# Patient Record
Sex: Male | Born: 1990 | Race: White | Hispanic: No | Marital: Single | Smoking: Current some day smoker
Health system: Southern US, Community
[De-identification: ages and names within clinical notes are randomized; demographics above are authoritative.]

## PROBLEM LIST (undated history)

## (undated) DIAGNOSIS — A4902 Methicillin resistant Staphylococcus aureus infection, unspecified site: Secondary | ICD-10-CM

## (undated) HISTORY — PX: TONSILLECTOMY: SUR1361

---
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
## 2011-01-31 ENCOUNTER — Emergency Department (HOSPITAL_COMMUNITY)
Admission: EM | Admit: 2011-01-31 | Discharge: 2011-01-31 | Discharge status: Against Medical Advice | Payer: Medicaid Other | Attending: Emergency Medicine | Admitting: Emergency Medicine

## 2011-01-31 ENCOUNTER — Encounter: Payer: Self-pay | Admitting: *Deleted

## 2011-01-31 DIAGNOSIS — R238 Other skin changes: Secondary | ICD-10-CM | POA: Insufficient documentation

## 2011-01-31 NOTE — ED Notes (Signed)
Pt in c/o abscess to left hand x3-4 days

## 2011-03-10 ENCOUNTER — Emergency Department (INDEPENDENT_AMBULATORY_CARE_PROVIDER_SITE_OTHER)
Admission: EM | Admit: 2011-03-10 | Discharge: 2011-03-10 | Disposition: A | Discharge status: Home / Self Care | Payer: Medicaid Other | Source: Home / Self Care | Attending: Emergency Medicine | Admitting: Emergency Medicine

## 2011-03-10 ENCOUNTER — Encounter (HOSPITAL_COMMUNITY): Payer: Self-pay | Admitting: Emergency Medicine

## 2011-03-10 DIAGNOSIS — IMO0002 Reserved for concepts with insufficient information to code with codable children: Secondary | ICD-10-CM

## 2011-03-10 DIAGNOSIS — L02413 Cutaneous abscess of right upper limb: Secondary | ICD-10-CM

## 2011-03-10 HISTORY — DX: Methicillin resistant Staphylococcus aureus infection, unspecified site: A49.02

## 2011-03-10 MED ORDER — HYDROCODONE-ACETAMINOPHEN 5-325 MG PO TABS: 2.0000 | ORAL_TABLET | ORAL | Status: AC | PRN

## 2011-03-10 MED ORDER — LIDOCAINE HCL 1 % IJ SOLN
5.0000 mL | Freq: Once | INTRAMUSCULAR | Status: AC
  Administered 2011-03-10: 5 mL

## 2011-03-10 MED ORDER — BACITRACIN 500 UNIT/GM EX OINT
1.0000 "application " | TOPICAL_OINTMENT | Freq: Once | CUTANEOUS | Status: AC
  Administered 2011-03-10: 1 via TOPICAL

## 2011-03-10 MED ORDER — IBUPROFEN 600 MG PO TABS: 600.0000 mg | ORAL_TABLET | Freq: Four times a day (QID) | ORAL | Status: AC | PRN

## 2011-03-10 MED ORDER — DOXYCYCLINE HYCLATE 100 MG PO CAPS: 100.0000 mg | ORAL_CAPSULE | Freq: Two times a day (BID) | ORAL | Status: AC

## 2011-03-10 NOTE — ED Notes (Signed)
Patient has a large, red area to right elbow.  Scabbing to wound.  Patient has been using tweezers, etc and squeezing .  Onset 6 days ago.  Reports hot and cold episodes last night, unknown fever.

## 2011-03-10 NOTE — ED Provider Notes (Signed)
History     CSN: [id number]  Arrival date & time 03/10/11  1105   First MD Initiated Contact with Patient 03/10/11 1121      Chief Complaint  Patient presents with  . Abscess    (Consider location/radiation/quality/duration/timing/severity/associated sxs/prior treatment) HPI Comments: Patient with painful, erythematous mass ofgradually increasing size over his right elbow started 6 days ago after sustaining minor trauma to the area. Patient states that he has been squeezing the area and expressing purulent material, however, reports redness streaking up the inner aspect of his arm last night. Now reports painful swelling underneath his right axilla. States he felt "hot" last night, but no documented fevers. No nausea, vomiting, other lesions currently. Patient states is not diabetic. Has a history of recurrent MRSA infections.  ROS as noted in HPI. All other ROS negative.   Patient is a 21 y.o. male presenting with abscess. The history is provided by the patient.  Abscess  This is a recurrent problem. The problem has been gradually worsening. The abscess is present on the right arm.    Past Medical History  Diagnosis Date  . MRSA infection     Past Surgical History  Procedure Date  . Tonsillectomy     No family history on file.  History  Substance Use Topics  . Smoking status: Current Everyday Smoker  . Smokeless tobacco: Not on file  . Alcohol Use: No      Review of Systems  Allergies  Review of patient's allergies indicates no known allergies.  Home Medications   Current Outpatient Rx  Name Route Sig Dispense Refill  . DOXYCYCLINE HYCLATE 100 MG PO CAPS Oral Take 1 capsule (100 mg total) by mouth 2 (two) times daily. 20 capsule 0  . HYDROCODONE-ACETAMINOPHEN 5-325 MG PO TABS Oral Take 2 tablets by mouth every 4 (four) hours as needed for pain. 20 tablet 0  . IBUPROFEN 600 MG PO TABS Oral Take 1 tablet (600 mg total) by mouth every 6 (six) hours as needed for  pain. 30 tablet 0    BP 143/93  Pulse 94  Temp(Src) 97.5 F (36.4 C) (Oral)  Resp 16  SpO2 98%  Physical Exam  Nursing note and vitals reviewed. Constitutional: He is oriented to person, place, and time. He appears well-developed and well-nourished.  HENT:  Head: Normocephalic and atraumatic.  Eyes: Conjunctivae and EOM are normal.  Neck: Normal range of motion.  Cardiovascular: Regular rhythm.   Pulmonary/Chest: Effort normal. No respiratory distress.  Abdominal: He exhibits no distension.  Musculoskeletal: Normal range of motion.       Arms: Neurological: He is alert and oriented to person, place, and time.  Skin: Skin is warm and dry.  Psychiatric: He has a normal mood and affect. His behavior is normal.    ED Course  INCISION AND DRAINAGE Performed by: Luiz Blare Authorized by: Luiz Blare Consent: Verbal consent obtained. Written consent not obtained. Risks and benefits: risks, benefits and alternatives were discussed Consent given by: patient Patient understanding: patient states understanding of the procedure being performed Patient consent: the patient's understanding of the procedure matches consent given Procedure consent: procedure consent matches procedure scheduled Site marked: the operative site was marked Required items: required blood products, implants, devices, and special equipment available Patient identity confirmed: verbally with patient and arm band Time out: Immediately prior to procedure a "time out" was called to verify the correct patient, procedure, equipment, support staff and site/side marked as required. Type: abscess  Body area: upper extremity Location details: right arm Anesthesia: local infiltration Local anesthetic: lidocaine 2% without epinephrine Anesthetic total: 5 ml Patient sedated: no Scalpel size: 11 Incision type: Cruciate. Complexity: simple Drainage: purulent and bloody Drainage amount: moderate Wound  treatment: wound left open Patient tolerance: Patient tolerated the procedure well with no immediate complications. Comments: Perform blunt dissection to break up loculations. Marked area of induration. Apply bacitracin, sterile pressure dressing.   (including critical care time)  Labs Reviewed - No data to display No results found.   1. Abscess of arm, right       MDM  Patient seen ED for multiple MRSA abscesses in the past. Performed I&D today. Marked area of induration for reference. Appears nontoxic. Afebrile here, no history of fever at home. Starting on doxycycline. Bringing patient back tomorrow for reevaluation. If no improvement, will send to ED for IV antibiotics. If patient's pain, redness gets worse tonight or patient has fever, patient go immediately to the ED. Patient and family member expressed understanding.  Luiz Blare, MD 03/10/11 [phone]

## 2011-03-10 NOTE — ED Notes (Signed)
Patient also has burning pain up right arm and large area of swelling in right axilla

## 2011-03-11 ENCOUNTER — Emergency Department (HOSPITAL_COMMUNITY)
Admission: EM | Admit: 2011-03-11 | Discharge: 2011-03-11 | Disposition: A | Discharge status: Home / Self Care | Payer: Medicaid Other | Source: Home / Self Care | Attending: Emergency Medicine | Admitting: Emergency Medicine

## 2011-03-11 DIAGNOSIS — Z5321 Procedure and treatment not carried out due to patient leaving prior to being seen by health care provider: Secondary | ICD-10-CM

## 2011-03-11 NOTE — ED Notes (Signed)
Pt called in lobby x 1 

## 2011-03-11 NOTE — ED Notes (Signed)
Patient was not in lobby or outside.  Per Dr Andreas Blower request I called patient.  I spoke with patient's mother.   She stated patient's daughter soiled her clothes and they had to go home and change her.  Mother stated that redness and swelling was getting better.  I explained the importance of returning for re-evaluation.  Mother expressed understanding.  Mother stated they would try to return today.  I explained again the importance of him being seen today.  I explained to mother that the ER was always open and if they could not return during our hours they could go to ER.  Mother expressed understanding

## 2011-03-12 ENCOUNTER — Encounter (HOSPITAL_COMMUNITY): Payer: Self-pay | Admitting: *Deleted

## 2011-03-12 ENCOUNTER — Emergency Department (HOSPITAL_COMMUNITY)
Admission: EM | Admit: 2011-03-12 | Discharge: 2011-03-12 | Disposition: A | Discharge status: Home / Self Care | Payer: Medicaid Other | Attending: Emergency Medicine | Admitting: Emergency Medicine

## 2011-03-12 DIAGNOSIS — Z9889 Other specified postprocedural states: Secondary | ICD-10-CM | POA: Insufficient documentation

## 2011-03-12 DIAGNOSIS — L0293 Carbuncle, unspecified: Secondary | ICD-10-CM

## 2011-03-12 DIAGNOSIS — L0292 Furuncle, unspecified: Secondary | ICD-10-CM | POA: Insufficient documentation

## 2011-03-12 DIAGNOSIS — Z8614 Personal history of Methicillin resistant Staphylococcus aureus infection: Secondary | ICD-10-CM | POA: Insufficient documentation

## 2011-03-12 MED ORDER — BACITRACIN ZINC 500 UNIT/GM EX OINT
TOPICAL_OINTMENT | CUTANEOUS | Status: AC
  Filled 2011-03-12: qty 0.9

## 2011-03-12 NOTE — ED Notes (Signed)
Pt states "was seen @ [facility] x 2 days ago, was told it was MRSA & needed to f/u, we went back but it was too full so we left, they called & said we needed to be seen, it actually looks better since taking the abs, I've had [facility] several other places"

## 2011-03-12 NOTE — ED Provider Notes (Signed)
History     CSN: [id number]  Arrival date & time 03/12/11  1245   First MD Initiated Contact with Patient 03/12/11 1316      Chief Complaint  Patient presents with  . Wound Infection    (Consider location/radiation/quality/duration/timing/severity/associated sxs/prior treatment) The history is provided by the patient.   patient is a 21 year old male presents for followup of I&D of abscess that was done in urgent care 2 days ago. Patient went back to urgent care but the wait was too long so he came here. Patient's had history of MRSA infections in the past was restarted on the doxycycline 2 days ago. Patient states that the abscess there is doing much better compared to wear was 2 days ago.  Past Medical History  Diagnosis Date  . MRSA infection     Past Surgical History  Procedure Date  . Tonsillectomy     No family history on file.  History  Substance Use Topics  . Smoking status: Current Everyday Smoker  . Smokeless tobacco: Not on file  . Alcohol Use: No      Review of Systems  Constitutional: Negative for fever and chills.  HENT: Negative for congestion, neck pain and neck stiffness.   Eyes: Negative for redness.  Respiratory: Negative for cough and shortness of breath.   Cardiovascular: Negative for chest pain.  Gastrointestinal: Negative for nausea, vomiting and abdominal pain.  Genitourinary: Negative for dysuria.  Musculoskeletal: Negative for back pain.  Skin: Positive for wound. Negative for rash.  Neurological: Negative for headaches.  Hematological: Does not bruise/bleed easily.    Allergies  Review of patient's allergies indicates no known allergies.  Home Medications   Current Outpatient Rx  Name Route Sig Dispense Refill  . DOXYCYCLINE HYCLATE 100 MG PO CAPS Oral Take 1 capsule (100 mg total) by mouth 2 (two) times daily. 20 capsule 0  . HYDROCODONE-ACETAMINOPHEN 5-325 MG PO TABS Oral Take 2 tablets by mouth every 4 (four) hours as needed  for pain. 20 tablet 0  . IBUPROFEN 600 MG PO TABS Oral Take 1 tablet (600 mg total) by mouth every 6 (six) hours as needed for pain. 30 tablet 0    BP 139/81  Pulse 96  Temp(Src) 98 F (36.7 C) (Oral)  Resp 18  Wt 205 lb (92.987 kg)  SpO2 98%  Physical Exam  Nursing note and vitals reviewed. Constitutional: He is oriented to person, place, and time. He appears well-developed and well-nourished. No distress.  HENT:  Head: Normocephalic and atraumatic.  Eyes: Conjunctivae and EOM are normal. Pupils are equal, round, and reactive to light.  Neck: Normal range of motion. Neck supple.  Cardiovascular: Normal rate, regular rhythm and normal heart sounds.   No murmur heard. Pulmonary/Chest: Effort normal and breath sounds normal.  Abdominal: Soft. Bowel sounds are normal. There is no tenderness.  Musculoskeletal: He exhibits tenderness. He exhibits no edema.       Normal except for right posterior elbow with 2 cm open carbuncle abscess no purulent discharge some surrounding indurated induration erythema and only about 5 mm around the edge of the wound. Distal neurovascular is intact. No significant axillary adenopathy no red streaking.  Neurological: He is alert and oriented to person, place, and time. No cranial nerve deficit. He exhibits normal muscle tone. Coordination normal.  Skin: Skin is warm. No rash noted. There is erythema.    ED Course  Procedures (including critical care time)  Labs Reviewed - No data to display No  results found.   1. Carbuncle       MDM    Patient to the emergency department status post I&D of abscess of right posterior elbow at urgent care 2 days ago. The patient went back to urgent care but the wait was too long so he came here. The patient is currently taking doxycycline. [place] the patient abscess area is doing much better much smaller drained a lot of pus and the discomfort he had in his axillary area due to adenopathy has resolved. Independent  marker around the abscessed marking erythema erythema is markedly improved no further fluctuance some induration still present.  Patient struck the dorsal wound daily with soap and water continue take the doxycycline and to dress the wound with Neosporin and clean dressing made we use Ace wrap to wrap it.        Shelda Jakes, MD 03/12/11 [phone]

## 2011-03-12 NOTE — ED Notes (Signed)
Confirmed MRSA by urgent care. F/U needed due to possible lymph node involvement. Pt states some drainage at site of elbow.

## 2011-03-12 NOTE — ED Provider Notes (Signed)
History     CSN: [id number]  Arrival date & time 03/11/11  1144   First MD Initiated Contact with Patient 03/11/11 1202      No chief complaint on file.   (Consider location/radiation/quality/duration/timing/severity/associated sxs/prior treatment) HPI  Past Medical History  Diagnosis Date  . MRSA infection     Past Surgical History  Procedure Date  . Tonsillectomy     No family history on file.  History  Substance Use Topics  . Smoking status: Current Everyday Smoker  . Smokeless tobacco: Not on file  . Alcohol Use: No      Review of Systems  Allergies  Review of patient's allergies indicates no known allergies.  Home Medications   Current Outpatient Rx  Name Route Sig Dispense Refill  . DOXYCYCLINE HYCLATE 100 MG PO CAPS Oral Take 1 capsule (100 mg total) by mouth 2 (two) times daily. 20 capsule 0  . HYDROCODONE-ACETAMINOPHEN 5-325 MG PO TABS Oral Take 2 tablets by mouth every 4 (four) hours as needed for pain. 20 tablet 0  . IBUPROFEN 600 MG PO TABS Oral Take 1 tablet (600 mg total) by mouth every 6 (six) hours as needed for pain. 30 tablet 0    There were no vitals taken for this visit.  Physical Exam  ED Course  Procedures (including critical care time)  Labs Reviewed - No data to display No results found.   No diagnosis found.    MDM  Patient left prior to triage. Had staff call patient, to ask him to come back. They explained to patient importance of followup either here, or in ED, today. Patient's mother expressed understanding.  Luiz Blare, MD 03/12/11 1023

## 2011-04-17 ENCOUNTER — Telehealth: Payer: Self-pay | Admitting: *Deleted

## 2011-04-17 NOTE — Telephone Encounter (Signed)
ERROR

## 2016-01-28 ENCOUNTER — Encounter (HOSPITAL_COMMUNITY): Payer: Self-pay | Admitting: Emergency Medicine

## 2016-01-28 ENCOUNTER — Ambulatory Visit (HOSPITAL_COMMUNITY)
Admission: EM | Admit: 2016-01-28 | Discharge: 2016-01-28 | Disposition: A | Discharge status: Home / Self Care | Payer: Medicaid Other | Attending: Family Medicine | Admitting: Family Medicine

## 2016-01-28 DIAGNOSIS — L2389 Allergic contact dermatitis due to other agents: Secondary | ICD-10-CM | POA: Diagnosis not present

## 2016-01-28 MED ORDER — METHYLPREDNISOLONE SODIUM SUCC 125 MG IJ SOLR
125.0000 mg | Freq: Once | INTRAMUSCULAR | Status: AC
  Administered 2016-01-28: 125 mg via INTRAMUSCULAR

## 2016-01-28 MED ORDER — PREDNISOLONE 15 MG/5ML PO SYRP: ORAL_SOLUTION | ORAL | 0 refills | Status: AC

## 2016-01-28 MED ORDER — HYDROXYZINE HCL 10 MG/5ML PO SYRP: 25.0000 mg | ORAL_SOLUTION | ORAL | 0 refills | Status: DC | PRN

## 2016-01-28 MED ORDER — METHYLPREDNISOLONE SODIUM SUCC 125 MG IJ SOLR
INTRAMUSCULAR | Status: AC
  Filled 2016-01-28: qty 2

## 2016-01-28 MED ORDER — HYDROXYZINE HCL 10 MG/5ML PO SYRP: 25.0000 mg | ORAL_SOLUTION | ORAL | 0 refills | Status: AC | PRN

## 2016-01-28 NOTE — ED Triage Notes (Signed)
Here for rash on face, arms, and abd onset 2 days associated w/swelling on face  Reports he was poss exposed to poison ivy   Taking benadryl and calamine lotion w/no relief.   Denies dyspnea, sob, dysphagia  A&O x4... NAD

## 2016-01-28 NOTE — ED Provider Notes (Signed)
CSN: [id number]     Arrival date & time 01/28/16  1818 History   First MD Initiated Contact with Patient 01/28/16 1953     Chief Complaint  Patient presents with  . Rash   (Consider location/radiation/quality/duration/timing/severity/associated sxs/prior Treatment) Patient c/o rash on arms and face for 2 days. 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   The history is provided by the patient.  Rash  Location:  Full body Quality: blistering, itchiness and redness   Severity:  Severe Onset quality:  Sudden Duration:  2 days Timing:  Constant Progression:  Worsening Chronicity:  New Context: plant contact   Relieved by:  Antihistamines Worsened by:  Nothing Ineffective treatments:  None tried   Past Medical History:  Diagnosis Date  . MRSA infection    Past Surgical History:  Procedure Laterality Date  . TONSILLECTOMY     History reviewed. No pertinent family history. Social History  Substance Use Topics  . Smoking status: Current Every Day Smoker    Packs/day: 1.00  . Smokeless tobacco: Never Used  . Alcohol use No    Review of Systems  Constitutional: Negative.   HENT: Negative.   Eyes: Negative.   Respiratory: Negative.   Cardiovascular: Negative.   Gastrointestinal: Negative.   Endocrine: Negative.   Genitourinary: Negative.   Musculoskeletal: Negative.   Skin: Positive for rash.  Allergic/Immunologic: Negative.   Neurological: Negative.   Hematological: Negative.   Psychiatric/Behavioral: Negative.     Allergies  Patient has no known allergies.  Home Medications   Prior to Admission medications   Medication Sig Start Date End Date Taking? Authorizing Provider  buprenorphine-naloxone (SUBOXONE) 8-2 MG SUBL SL tablet Place 1 tablet under the tongue daily.   Yes Historical Provider, MD  hydrOXYzine (ATARAX) 10 MG/5ML syrup Take 12.5 mLs (25 mg total) by mouth every 4 (four) hours as needed. 01/28/16   Deatra CanterWilliam J Baine Decesare, FNP   prednisoLONE (PRELONE) 15 MG/5ML syrup Take one and half tsp po bid x 2 days then one tsp po bid x 2days then one tsp po qd x 2 days 01/28/16   Deatra CanterWilliam J Chevi Lim, FNP   Meds Ordered and Administered this Visit   Medications  methylPREDNISolone sodium succinate (SOLU-MEDROL) 125 mg/2 mL injection 125 mg (not administered)    BP 118/60 (BP Location: Left Arm)   Pulse 86   Temp 97.9 F (36.6 C) (Oral)   Resp 20   SpO2 99%  No data found.   Physical Exam  Constitutional: He appears well-developed and well-nourished.  HENT:  Head: Normocephalic and atraumatic.  Right Ear: External ear normal.  Left Ear: External ear normal.  Mouth/Throat: Oropharynx is clear and moist.  Eyes: Conjunctivae and EOM are normal. Pupils are equal, round, and reactive to light.  Neck: Normal range of motion. Neck supple.  Cardiovascular: Normal rate, regular rhythm and normal heart sounds.   Pulmonary/Chest: Effort normal and breath sounds normal.  Skin: Rash noted.  Erythematous raised itchy rash on face, arms, and chest.  Nursing note and vitals reviewed.   Urgent Care Course   Clinical Course     Procedures (including critical care time)  Labs Review Labs Reviewed - No data to display  Imaging Review No results found.   Visual Acuity Review  Right Eye Distance:   Left Eye Distance:   Bilateral Distance:    Right Eye Near:   Left Eye Near:    Bilateral Near:  MDM   1. Allergic contact dermatitis due to other agents    Solumedrol 125mg  IM Prednisolone syrup 15mg  / 5ml one and half tsp po bid x 2 days then one tsp po bid x 2 days then one tsp po qd x 4 days then stop #15800ml Hydroxyzine 10mg /375ml 12.775ml po q 4 hours prn #23940ml      Deatra CanterWilliam J Kaylanni Ezelle, FNP 01/28/16 2008

## 2016-01-28 NOTE — ED Notes (Signed)
Discharged with wife.

## 2020-02-26 ENCOUNTER — Emergency Department (HOSPITAL_COMMUNITY): Payer: Medicaid Other

## 2020-02-26 ENCOUNTER — Emergency Department (HOSPITAL_COMMUNITY)
Admission: EM | Admit: 2020-02-26 | Discharge: 2020-02-26 | Disposition: A | Discharge status: Home / Self Care | Payer: Medicaid Other | Source: Home / Self Care | Attending: Emergency Medicine | Admitting: Emergency Medicine

## 2020-02-26 ENCOUNTER — Other Ambulatory Visit: Payer: Self-pay

## 2020-02-26 ENCOUNTER — Encounter (HOSPITAL_COMMUNITY): Payer: Self-pay | Admitting: *Deleted

## 2020-02-26 DIAGNOSIS — S71102A Unspecified open wound, left thigh, initial encounter: Secondary | ICD-10-CM | POA: Insufficient documentation

## 2020-02-26 DIAGNOSIS — F17200 Nicotine dependence, unspecified, uncomplicated: Secondary | ICD-10-CM | POA: Insufficient documentation

## 2020-02-26 DIAGNOSIS — Z20822 Contact with and (suspected) exposure to covid-19: Secondary | ICD-10-CM | POA: Insufficient documentation

## 2020-02-26 DIAGNOSIS — W3400XA Accidental discharge from unspecified firearms or gun, initial encounter: Secondary | ICD-10-CM

## 2020-02-26 LAB — PROTIME-INR
INR: 1 (ref 0.8–1.2)
Prothrombin Time: 13.2 seconds (ref 11.4–15.2)

## 2020-02-26 LAB — RESP PANEL BY RT-PCR (FLU A&B, COVID) ARPGX2
Influenza A by PCR: NEGATIVE
Influenza B by PCR: NEGATIVE
SARS Coronavirus 2 by RT PCR: NEGATIVE

## 2020-02-26 LAB — I-STAT CHEM 8, ED
BUN: 12 mg/dL (ref 6–20)
Calcium, Ion: 1.15 mmol/L (ref 1.15–1.40)
Chloride: 102 mmol/L (ref 98–111)
Creatinine, Ser: 0.8 mg/dL (ref 0.61–1.24)
Glucose, Bld: 142 mg/dL — ABNORMAL HIGH (ref 70–99)
HCT: 44 % (ref 39.0–52.0)
Hemoglobin: 15 g/dL (ref 13.0–17.0)
Potassium: 3.7 mmol/L (ref 3.5–5.1)
Sodium: 138 mmol/L (ref 135–145)
TCO2: 27 mmol/L (ref 22–32)

## 2020-02-26 LAB — COMPREHENSIVE METABOLIC PANEL
ALT: 23 U/L (ref 0–44)
AST: 29 U/L (ref 15–41)
Albumin: 4.3 g/dL (ref 3.5–5.0)
Alkaline Phosphatase: 85 U/L (ref 38–126)
Anion gap: 10 (ref 5–15)
BUN: 12 mg/dL (ref 6–20)
CO2: 26 mmol/L (ref 22–32)
Calcium: 9.1 mg/dL (ref 8.9–10.3)
Chloride: 103 mmol/L (ref 98–111)
Creatinine, Ser: 0.89 mg/dL (ref 0.61–1.24)
GFR, Estimated: >60 mL/min (ref >60)
Glucose, Bld: 149 mg/dL — ABNORMAL HIGH (ref 70–99)
Potassium: 3.7 mmol/L (ref 3.5–5.1)
Sodium: 139 mmol/L (ref 135–145)
Total Bilirubin: 1 mg/dL (ref 0.3–1.2)
Total Protein: 7 g/dL (ref 6.5–8.1)

## 2020-02-26 LAB — CBC
HCT: 45.1 % (ref 39.0–52.0)
Hemoglobin: 14.2 g/dL (ref 13.0–17.0)
MCH: 27.7 pg (ref 26.0–34.0)
MCHC: 31.5 g/dL (ref 30.0–36.0)
MCV: 87.9 fL (ref 80.0–100.0)
Platelets: 225 10*3/uL (ref 150–400)
RBC: 5.13 MIL/uL (ref 4.22–5.81)
RDW: 12.9 % (ref 11.5–15.5)
WBC: 19.2 10*3/uL — ABNORMAL HIGH (ref 4.0–10.5)
nRBC: 0 % (ref 0.0–0.2)

## 2020-02-26 LAB — ETHANOL: Alcohol, Ethyl (B): <10 mg/dL (ref <10)

## 2020-02-26 LAB — LACTIC ACID, PLASMA: Lactic Acid, Venous: 1.4 mmol/L (ref 0.5–1.9)

## 2020-02-26 LAB — SAMPLE TO BLOOD BANK

## 2020-02-26 MED ORDER — CEFAZOLIN SODIUM-DEXTROSE 2-4 GM/100ML-% IV SOLN
2.0000 g | Freq: Once | INTRAVENOUS | Status: AC
  Administered 2020-02-26: 2 g via INTRAVENOUS
  Filled 2020-02-26: qty 100

## 2020-02-26 MED ORDER — LACTATED RINGERS IV BOLUS
1000.0000 mL | Freq: Once | INTRAVENOUS | Status: AC
  Administered 2020-02-26: 1000 mL via INTRAVENOUS

## 2020-02-26 MED ORDER — FENTANYL CITRATE (PF) 100 MCG/2ML IJ SOLN
100.0000 ug | Freq: Once | INTRAMUSCULAR | Status: AC
  Administered 2020-02-26: 100 ug via INTRAVENOUS
  Filled 2020-02-26: qty 2

## 2020-02-26 NOTE — ED Notes (Signed)
Pt removed all monitoring equipment on his own. Stating he is wanting to leave. Dr. Blinda Leatherwood has spoke to the pt and his sig other at the bedside who is taking him home. Risk and benefits enforced by EDP. Pt signed AMA form, witnessed by this RN.
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
## 2020-02-27 ENCOUNTER — Emergency Department (HOSPITAL_COMMUNITY): Payer: Medicaid Other

## 2020-02-27 ENCOUNTER — Inpatient Hospital Stay (HOSPITAL_COMMUNITY)
Admission: EM | Admit: 2020-02-27 | Discharge: 2020-03-02 | DRG: 481 | Discharge status: Against Medical Advice | Payer: Medicaid Other | Attending: Orthopedic Surgery | Admitting: Orthopedic Surgery

## 2020-02-27 DIAGNOSIS — Z9889 Other specified postprocedural states: Secondary | ICD-10-CM

## 2020-02-27 DIAGNOSIS — W3400XA Accidental discharge from unspecified firearms or gun, initial encounter: Secondary | ICD-10-CM

## 2020-02-27 DIAGNOSIS — S7222XA Displaced subtrochanteric fracture of left femur, initial encounter for closed fracture: Secondary | ICD-10-CM | POA: Diagnosis present

## 2020-02-27 DIAGNOSIS — R7989 Other specified abnormal findings of blood chemistry: Secondary | ICD-10-CM

## 2020-02-27 DIAGNOSIS — S7292XA Unspecified fracture of left femur, initial encounter for closed fracture: Principal | ICD-10-CM

## 2020-02-27 DIAGNOSIS — S72352A Displaced comminuted fracture of shaft of left femur, initial encounter for closed fracture: Secondary | ICD-10-CM | POA: Diagnosis present

## 2020-02-27 DIAGNOSIS — S72002A Fracture of unspecified part of neck of left femur, initial encounter for closed fracture: Secondary | ICD-10-CM

## 2020-02-27 DIAGNOSIS — R000 Tachycardia, unspecified: Secondary | ICD-10-CM

## 2020-02-27 DIAGNOSIS — Z20822 Contact with and (suspected) exposure to covid-19: Secondary | ICD-10-CM | POA: Diagnosis present

## 2020-02-27 DIAGNOSIS — Z8614 Personal history of Methicillin resistant Staphylococcus aureus infection: Secondary | ICD-10-CM

## 2020-02-27 DIAGNOSIS — Z8781 Personal history of (healed) traumatic fracture: Secondary | ICD-10-CM

## 2020-02-27 DIAGNOSIS — F17210 Nicotine dependence, cigarettes, uncomplicated: Secondary | ICD-10-CM | POA: Diagnosis present

## 2020-02-27 DIAGNOSIS — F1510 Other stimulant abuse, uncomplicated: Secondary | ICD-10-CM | POA: Diagnosis present

## 2020-02-27 DIAGNOSIS — Z5329 Procedure and treatment not carried out because of patient's decision for other reasons: Secondary | ICD-10-CM | POA: Diagnosis not present

## 2020-02-27 DIAGNOSIS — E871 Hypo-osmolality and hyponatremia: Secondary | ICD-10-CM | POA: Diagnosis present

## 2020-02-27 DIAGNOSIS — Z79899 Other long term (current) drug therapy: Secondary | ICD-10-CM

## 2020-02-27 DIAGNOSIS — S7222XB Displaced subtrochanteric fracture of left femur, initial encounter for open fracture type I or II: Principal | ICD-10-CM | POA: Diagnosis present

## 2020-02-27 LAB — COMPREHENSIVE METABOLIC PANEL
ALT: 20 U/L (ref 0–44)
AST: 33 U/L (ref 15–41)
Albumin: 4 g/dL (ref 3.5–5.0)
Alkaline Phosphatase: 82 U/L (ref 38–126)
Anion gap: 11 (ref 5–15)
BUN: 8 mg/dL (ref 6–20)
CO2: 26 mmol/L (ref 22–32)
Calcium: 8.9 mg/dL (ref 8.9–10.3)
Chloride: 98 mmol/L (ref 98–111)
Creatinine, Ser: 0.78 mg/dL (ref 0.61–1.24)
GFR, Estimated: >60 mL/min (ref >60)
Glucose, Bld: 152 mg/dL — ABNORMAL HIGH (ref 70–99)
Potassium: 3.9 mmol/L (ref 3.5–5.1)
Sodium: 135 mmol/L (ref 135–145)
Total Bilirubin: 1.5 mg/dL — ABNORMAL HIGH (ref 0.3–1.2)
Total Protein: 7.3 g/dL (ref 6.5–8.1)

## 2020-02-27 LAB — CBC WITH DIFFERENTIAL/PLATELET
Abs Immature Granulocytes: 0.04 10*3/uL (ref 0.00–0.07)
Basophils Absolute: 0 10*3/uL (ref 0.0–0.1)
Basophils Relative: 0 %
Eosinophils Absolute: 0 10*3/uL (ref 0.0–0.5)
Eosinophils Relative: 0 %
HCT: 40.7 % (ref 39.0–52.0)
Hemoglobin: 12.9 g/dL — ABNORMAL LOW (ref 13.0–17.0)
Immature Granulocytes: 0 %
Lymphocytes Relative: 8 %
Lymphs Abs: 1.1 10*3/uL (ref 0.7–4.0)
MCH: 27.9 pg (ref 26.0–34.0)
MCHC: 31.7 g/dL (ref 30.0–36.0)
MCV: 88.1 fL (ref 80.0–100.0)
Monocytes Absolute: 1.4 10*3/uL — ABNORMAL HIGH (ref 0.1–1.0)
Monocytes Relative: 10 %
Neutro Abs: 11.5 10*3/uL — ABNORMAL HIGH (ref 1.7–7.7)
Neutrophils Relative %: 82 %
Platelets: 215 10*3/uL (ref 150–400)
RBC: 4.62 MIL/uL (ref 4.22–5.81)
RDW: 13 % (ref 11.5–15.5)
WBC: 14.1 10*3/uL — ABNORMAL HIGH (ref 4.0–10.5)
nRBC: 0 % (ref 0.0–0.2)

## 2020-02-27 LAB — TYPE AND SCREEN
ABO/RH(D): O POS
Antibody Screen: NEGATIVE

## 2020-02-27 LAB — RESP PANEL BY RT-PCR (FLU A&B, COVID) ARPGX2
Influenza A by PCR: NEGATIVE
Influenza B by PCR: NEGATIVE
SARS Coronavirus 2 by RT PCR: NEGATIVE

## 2020-02-27 MED ORDER — HYDROMORPHONE HCL 1 MG/ML IJ SOLN
1.0000 mg | Freq: Once | INTRAMUSCULAR | Status: DC
  Filled 2020-02-27: qty 1

## 2020-02-27 MED ORDER — SODIUM CHLORIDE 0.9 % IV SOLN: INTRAVENOUS | Status: DC

## 2020-02-27 MED ORDER — OXYCODONE HCL 5 MG PO TABS: 10.0000 mg | ORAL_TABLET | ORAL | Status: DC | PRN

## 2020-02-27 MED ORDER — HYDROMORPHONE HCL 1 MG/ML IJ SOLN
0.5000 mg | INTRAMUSCULAR | Status: DC | PRN
  Administered 2020-02-27 – 2020-02-28 (×2): 1 mg via INTRAVENOUS
  Filled 2020-02-27 (×2): qty 1

## 2020-02-27 MED ORDER — ONDANSETRON HCL 4 MG/2ML IJ SOLN: 4.0000 mg | Freq: Four times a day (QID) | INTRAMUSCULAR | Status: DC | PRN

## 2020-02-27 MED ORDER — ACETAMINOPHEN 500 MG PO TABS
1000.0000 mg | ORAL_TABLET | Freq: Four times a day (QID) | ORAL | Status: AC
  Administered 2020-02-27: 1000 mg via ORAL
  Filled 2020-02-27 (×2): qty 2

## 2020-02-27 MED ORDER — METHOCARBAMOL 1000 MG/10ML IJ SOLN: 500.0000 mg | Freq: Four times a day (QID) | INTRAVENOUS | Status: DC | PRN

## 2020-02-27 MED ORDER — METHOCARBAMOL 500 MG PO TABS
500.0000 mg | ORAL_TABLET | Freq: Four times a day (QID) | ORAL | Status: DC | PRN
  Administered 2020-02-27: 500 mg via ORAL
  Filled 2020-02-27: qty 1

## 2020-02-27 MED ORDER — ACETAMINOPHEN 325 MG PO TABS
325.0000 mg | ORAL_TABLET | Freq: Four times a day (QID) | ORAL | Status: DC | PRN
Start: 2020-02-28 — End: 2020-02-28

## 2020-02-27 MED ORDER — OXYCODONE HCL 5 MG PO TABS: 5.0000 mg | ORAL_TABLET | ORAL | Status: DC | PRN

## 2020-02-27 MED ORDER — OXYCODONE-ACETAMINOPHEN 5-325 MG PO TABS
1.0000 | ORAL_TABLET | Freq: Once | ORAL | Status: AC
  Administered 2020-02-27: 1 via ORAL
  Filled 2020-02-27: qty 1

## 2020-02-27 MED ORDER — ONDANSETRON HCL 4 MG PO TABS: 4.0000 mg | ORAL_TABLET | Freq: Four times a day (QID) | ORAL | Status: DC | PRN

## 2020-02-27 NOTE — ED Notes (Signed)
Pt is sleeping at bedside but is easily awakened. Pt's friend remains at bedside for support. On monitor x4, lights dimmed and warm blanket given  For comfort.
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
## 2020-02-27 NOTE — ED Notes (Signed)
Blue and gold tube tops sent to lab.
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
## 2020-02-27 NOTE — ED Notes (Signed)
Initial contact with pt . Pt has friend at bedside for support. Pt states he is only in pain when leg is moved. Pt aware he will be admitted for surgery. Watching tv will continue to monitor.

## 2020-02-27 NOTE — ED Notes (Signed)
Visualized family assisting patient to transfer from wheelchair to triage chair. Triage RN previously verbalized plan to transfer patient to stretcher upon room clean to minimize number of transfers.

## 2020-02-28 ENCOUNTER — Inpatient Hospital Stay (HOSPITAL_COMMUNITY): Payer: Medicaid Other | Admitting: Anesthesiology

## 2020-02-28 ENCOUNTER — Inpatient Hospital Stay (HOSPITAL_COMMUNITY): Payer: Medicaid Other

## 2020-02-28 ENCOUNTER — Encounter (HOSPITAL_COMMUNITY)
Admission: EM | Discharge status: Against Medical Advice | Payer: Self-pay | Source: Home / Self Care | Attending: Orthopedic Surgery

## 2020-02-28 DIAGNOSIS — S7222XA Displaced subtrochanteric fracture of left femur, initial encounter for closed fracture: Secondary | ICD-10-CM | POA: Diagnosis present

## 2020-02-28 HISTORY — PX: INTRAMEDULLARY (IM) NAIL INTERTROCHANTERIC: SHX5875

## 2020-02-28 LAB — CBC
HCT: 33.4 % — ABNORMAL LOW (ref 39.0–52.0)
MCH: 28.5 pg (ref 26.0–34.0)
RDW: 12.7 % (ref 11.5–15.5)

## 2020-02-28 LAB — CREATININE, SERUM: Creatinine, Ser: 0.71 mg/dL (ref 0.61–1.24)

## 2020-02-28 SURGERY — FIXATION, FRACTURE, INTERTROCHANTERIC, WITH INTRAMEDULLARY ROD
Anesthesia: General | Site: Hip | Laterality: Left

## 2020-02-28 MED ORDER — ONDANSETRON HCL 4 MG/2ML IJ SOLN
INTRAMUSCULAR | Status: AC
  Filled 2020-02-28: qty 2

## 2020-02-28 MED ORDER — OXYCODONE HCL 5 MG PO TABS: 5.0000 mg | ORAL_TABLET | ORAL | Status: DC | PRN

## 2020-02-28 MED ORDER — MIDAZOLAM HCL 2 MG/2ML IJ SOLN
INTRAMUSCULAR | Status: DC | PRN
  Administered 2020-02-28: 2 mg via INTRAVENOUS

## 2020-02-28 MED ORDER — ALUM & MAG HYDROXIDE-SIMETH 200-200-20 MG/5ML PO SUSP
30.0000 mL | ORAL | Status: DC | PRN
  Filled 2020-02-28: qty 30

## 2020-02-28 MED ORDER — FENTANYL CITRATE (PF) 100 MCG/2ML IJ SOLN
INTRAMUSCULAR | Status: DC | PRN
  Administered 2020-02-28: 100 ug via INTRAVENOUS

## 2020-02-28 MED ORDER — DEXAMETHASONE SODIUM PHOSPHATE 10 MG/ML IJ SOLN
INTRAMUSCULAR | Status: DC | PRN
  Administered 2020-02-28: 10 mg via INTRAVENOUS

## 2020-02-28 MED ORDER — FENTANYL CITRATE (PF) 250 MCG/5ML IJ SOLN
INTRAMUSCULAR | Status: DC | PRN
  Administered 2020-02-28 (×2): 50 ug via INTRAVENOUS
  Administered 2020-02-28: 150 ug via INTRAVENOUS

## 2020-02-28 MED ORDER — BUPRENORPHINE HCL 8 MG SL SUBL
8.0000 mg | SUBLINGUAL_TABLET | Freq: Every day | SUBLINGUAL | Status: DC
  Filled 2020-02-28 (×3): qty 1

## 2020-02-28 MED ORDER — CEFAZOLIN SODIUM-DEXTROSE 2-4 GM/100ML-% IV SOLN
INTRAVENOUS | Status: AC
  Filled 2020-02-28: qty 100

## 2020-02-28 MED ORDER — KETAMINE HCL 10 MG/ML IJ SOLN
INTRAMUSCULAR | Status: AC
  Filled 2020-02-28: qty 1

## 2020-02-28 MED ORDER — ONDANSETRON HCL 4 MG PO TABS
4.0000 mg | ORAL_TABLET | Freq: Four times a day (QID) | ORAL | Status: DC | PRN
  Filled 2020-02-28: qty 1

## 2020-02-28 MED ORDER — HYDROMORPHONE HCL 1 MG/ML IJ SOLN
0.2500 mg | INTRAMUSCULAR | Status: DC | PRN
Start: 2020-02-28 — End: 2020-02-28
  Administered 2020-02-28 (×3): 0.5 mg via INTRAVENOUS

## 2020-02-28 MED ORDER — HYDROMORPHONE HCL 1 MG/ML IJ SOLN
0.5000 mg | INTRAMUSCULAR | Status: DC | PRN
  Administered 2020-02-29 – 2020-03-01 (×2): 1 mg via INTRAVENOUS
  Filled 2020-02-28 (×2): qty 1

## 2020-02-28 MED ORDER — PROPOFOL 10 MG/ML IV BOLUS
INTRAVENOUS | Status: AC
  Filled 2020-02-28: qty 20

## 2020-02-28 MED ORDER — ONDANSETRON HCL 4 MG/2ML IJ SOLN: 4.0000 mg | Freq: Four times a day (QID) | INTRAMUSCULAR | Status: DC | PRN

## 2020-02-28 MED ORDER — MAGNESIUM CITRATE PO SOLN
1.0000 | Freq: Once | ORAL | Status: DC | PRN
  Filled 2020-02-28: qty 296

## 2020-02-28 MED ORDER — OXYCODONE HCL 5 MG PO TABS
ORAL_TABLET | ORAL | Status: AC
  Filled 2020-02-28: qty 1

## 2020-02-28 MED ORDER — KETAMINE HCL 10 MG/ML IJ SOLN
INTRAMUSCULAR | Status: DC | PRN
  Administered 2020-02-28: 10 mg via INTRAVENOUS
  Administered 2020-02-28: 20 mg via INTRAVENOUS

## 2020-02-28 MED ORDER — DOCUSATE SODIUM 100 MG PO CAPS
100.0000 mg | ORAL_CAPSULE | Freq: Two times a day (BID) | ORAL | Status: DC
  Filled 2020-02-28 (×2): qty 1

## 2020-02-28 MED ORDER — ONDANSETRON HCL 4 MG/2ML IJ SOLN
INTRAMUSCULAR | Status: DC | PRN
  Administered 2020-02-28: 4 mg via INTRAVENOUS

## 2020-02-28 MED ORDER — PHENYLEPHRINE 40 MCG/ML (10ML) SYRINGE FOR IV PUSH (FOR BLOOD PRESSURE SUPPORT)
PREFILLED_SYRINGE | INTRAVENOUS | Status: AC
  Filled 2020-02-28: qty 10

## 2020-02-28 MED ORDER — CHLORHEXIDINE GLUCONATE 4 % EX LIQD: 60.0000 mL | Freq: Once | CUTANEOUS | Status: DC

## 2020-02-28 MED ORDER — HYDROMORPHONE HCL 1 MG/ML IJ SOLN
INTRAMUSCULAR | Status: AC
  Filled 2020-02-28: qty 1

## 2020-02-28 MED ORDER — BISACODYL 10 MG RE SUPP
10.0000 mg | Freq: Every day | RECTAL | Status: DC | PRN
  Filled 2020-02-28: qty 1

## 2020-02-28 MED ORDER — METHOCARBAMOL 500 MG PO TABS: 500.0000 mg | ORAL_TABLET | Freq: Four times a day (QID) | ORAL | Status: DC | PRN

## 2020-02-28 MED ORDER — HYDROMORPHONE HCL 1 MG/ML IJ SOLN
INTRAMUSCULAR | Status: AC
  Administered 2020-02-28: 1 mg
  Filled 2020-02-28: qty 1

## 2020-02-28 MED ORDER — ROCURONIUM BROMIDE 10 MG/ML (PF) SYRINGE
PREFILLED_SYRINGE | INTRAVENOUS | Status: DC | PRN
  Administered 2020-02-28: 30 mg via INTRAVENOUS
  Administered 2020-02-28: 10 mg via INTRAVENOUS
  Administered 2020-02-28 (×2): 20 mg via INTRAVENOUS
  Administered 2020-02-28: 70 mg via INTRAVENOUS

## 2020-02-28 MED ORDER — 0.9 % SODIUM CHLORIDE (POUR BTL) OPTIME
TOPICAL | Status: DC | PRN
  Administered 2020-02-28: 1000 mL

## 2020-02-28 MED ORDER — CEFAZOLIN SODIUM-DEXTROSE 2-4 GM/100ML-% IV SOLN
2.0000 g | INTRAVENOUS | Status: DC
  Filled 2020-02-28: qty 100

## 2020-02-28 MED ORDER — HYDROMORPHONE HCL 1 MG/ML IJ SOLN
INTRAMUSCULAR | Status: AC
  Administered 2020-02-28: 0.5 mg
  Filled 2020-02-28: qty 1

## 2020-02-28 MED ORDER — ASPIRIN EC 325 MG PO TBEC
325.0000 mg | DELAYED_RELEASE_TABLET | Freq: Two times a day (BID) | ORAL | Status: DC
  Administered 2020-02-28 – 2020-03-02 (×5): 325 mg via ORAL
  Filled 2020-02-28 (×6): qty 1

## 2020-02-28 MED ORDER — HYDROMORPHONE HCL 2 MG/ML IJ SOLN
INTRAMUSCULAR | Status: AC
  Filled 2020-02-28: qty 1

## 2020-02-28 MED ORDER — DEXMEDETOMIDINE (PRECEDEX) IN NS 20 MCG/5ML (4 MCG/ML) IV SYRINGE
PREFILLED_SYRINGE | INTRAVENOUS | Status: DC | PRN
  Administered 2020-02-28: 8 ug via INTRAVENOUS

## 2020-02-28 MED ORDER — LACTATED RINGERS IV SOLN: INTRAVENOUS | Status: DC | PRN

## 2020-02-28 MED ORDER — ONDANSETRON HCL 4 MG PO TABS: 4.0000 mg | ORAL_TABLET | Freq: Four times a day (QID) | ORAL | Status: DC | PRN

## 2020-02-28 MED ORDER — LIDOCAINE HCL (PF) 2 % IJ SOLN
INTRAMUSCULAR | Status: AC
  Filled 2020-02-28: qty 5

## 2020-02-28 MED ORDER — HYDROMORPHONE HCL 1 MG/ML IJ SOLN: 0.5000 mg | INTRAMUSCULAR | Status: DC | PRN

## 2020-02-28 MED ORDER — FERROUS SULFATE 325 (65 FE) MG PO TABS
325.0000 mg | ORAL_TABLET | Freq: Three times a day (TID) | ORAL | Status: DC
  Administered 2020-02-29 – 2020-03-02 (×3): 325 mg via ORAL
  Filled 2020-02-28 (×6): qty 1

## 2020-02-28 MED ORDER — ALBUMIN HUMAN 5 % IV SOLN
INTRAVENOUS | Status: AC
  Filled 2020-02-28: qty 250

## 2020-02-28 MED ORDER — ALBUMIN HUMAN 5 % IV SOLN: INTRAVENOUS | Status: DC | PRN

## 2020-02-28 MED ORDER — MIDAZOLAM HCL 2 MG/2ML IJ SOLN
INTRAMUSCULAR | Status: AC
  Filled 2020-02-28: qty 2

## 2020-02-28 MED ORDER — DIPHENHYDRAMINE HCL 12.5 MG/5ML PO ELIX
12.5000 mg | ORAL_SOLUTION | ORAL | Status: DC | PRN
Start: 2020-02-28 — End: 2020-03-01
  Filled 2020-02-28: qty 10

## 2020-02-28 MED ORDER — OXYCODONE HCL 5 MG PO TABS: 10.0000 mg | ORAL_TABLET | ORAL | Status: DC | PRN

## 2020-02-28 MED ORDER — ACETAMINOPHEN 500 MG PO TABS: 1000.0000 mg | ORAL_TABLET | Freq: Once | ORAL | Status: DC

## 2020-02-28 MED ORDER — POVIDONE-IODINE 10 % EX SWAB: 2.0000 "application " | Freq: Once | CUTANEOUS | Status: DC

## 2020-02-28 MED ORDER — DEXAMETHASONE SODIUM PHOSPHATE 10 MG/ML IJ SOLN
INTRAMUSCULAR | Status: AC
  Filled 2020-02-28: qty 1

## 2020-02-28 MED ORDER — NICOTINE 7 MG/24HR TD PT24
7.0000 mg | MEDICATED_PATCH | Freq: Every day | TRANSDERMAL | Status: DC
  Filled 2020-02-28: qty 1

## 2020-02-28 MED ORDER — ENSURE PRE-SURGERY PO LIQD
296.0000 mL | Freq: Once | ORAL | Status: DC
  Filled 2020-02-28: qty 296

## 2020-02-28 MED ORDER — LIDOCAINE 2% (20 MG/ML) 5 ML SYRINGE
INTRAMUSCULAR | Status: DC | PRN
  Administered 2020-02-28: 100 mg via INTRAVENOUS

## 2020-02-28 MED ORDER — CEFAZOLIN SODIUM-DEXTROSE 2-4 GM/100ML-% IV SOLN
2.0000 g | Freq: Four times a day (QID) | INTRAVENOUS | Status: AC
  Administered 2020-02-28: 2 g via INTRAVENOUS

## 2020-02-28 MED ORDER — PHENYLEPHRINE 40 MCG/ML (10ML) SYRINGE FOR IV PUSH (FOR BLOOD PRESSURE SUPPORT)
PREFILLED_SYRINGE | INTRAVENOUS | Status: DC | PRN
  Administered 2020-02-28: 80 ug via INTRAVENOUS
  Administered 2020-02-28: 160 ug via INTRAVENOUS
  Administered 2020-02-28: 120 ug via INTRAVENOUS
  Administered 2020-02-28: 80 ug via INTRAVENOUS

## 2020-02-28 MED ORDER — MENTHOL 3 MG MT LOZG
1.0000 | LOZENGE | OROMUCOSAL | Status: DC | PRN
  Filled 2020-02-28: qty 9

## 2020-02-28 MED ORDER — PROMETHAZINE HCL 25 MG/ML IJ SOLN: 6.2500 mg | INTRAMUSCULAR | Status: DC | PRN

## 2020-02-28 MED ORDER — METHOCARBAMOL 500 MG IVPB - SIMPLE MED
500.0000 mg | Freq: Four times a day (QID) | INTRAVENOUS | Status: DC | PRN
  Administered 2020-02-28 (×2): 500 mg via INTRAVENOUS
  Filled 2020-02-28 (×2): qty 500
  Filled 2020-02-28: qty 50

## 2020-02-28 MED ORDER — ACETAMINOPHEN 325 MG PO TABS: 325.0000 mg | ORAL_TABLET | Freq: Four times a day (QID) | ORAL | Status: DC | PRN

## 2020-02-28 MED ORDER — FENTANYL CITRATE (PF) 100 MCG/2ML IJ SOLN
INTRAMUSCULAR | Status: AC
  Filled 2020-02-28: qty 2

## 2020-02-28 MED ORDER — PHENOL 1.4 % MT LIQD
1.0000 | OROMUCOSAL | Status: DC | PRN
  Filled 2020-02-28: qty 177

## 2020-02-28 MED ORDER — HYDROMORPHONE HCL 1 MG/ML IJ SOLN
INTRAMUSCULAR | Status: DC | PRN
  Administered 2020-02-28: 1 mg via INTRAVENOUS
  Administered 2020-02-28: 0.5 mg via INTRAVENOUS

## 2020-02-28 MED ORDER — SUGAMMADEX SODIUM 200 MG/2ML IV SOLN
INTRAVENOUS | Status: DC | PRN
  Administered 2020-02-28: 200 mg via INTRAVENOUS

## 2020-02-28 MED ORDER — SENNA 8.6 MG PO TABS
1.0000 | ORAL_TABLET | Freq: Two times a day (BID) | ORAL | Status: DC
  Filled 2020-02-28 (×3): qty 1

## 2020-02-28 MED ORDER — OXYCODONE HCL 5 MG PO TABS
5.0000 mg | ORAL_TABLET | ORAL | Status: DC | PRN
  Administered 2020-02-28 – 2020-02-29 (×3): 10 mg via ORAL
  Administered 2020-02-29 (×2): 5 mg via ORAL
  Administered 2020-03-01 – 2020-03-02 (×6): 10 mg via ORAL
  Filled 2020-02-28 (×7): qty 2

## 2020-02-28 MED ORDER — CEFAZOLIN SODIUM-DEXTROSE 2-3 GM-%(50ML) IV SOLR
INTRAVENOUS | Status: DC | PRN
  Administered 2020-02-28: 2 g via INTRAVENOUS

## 2020-02-28 MED ORDER — POTASSIUM CHLORIDE IN NACL 20-0.45 MEQ/L-% IV SOLN
INTRAVENOUS | Status: DC
  Filled 2020-02-28 (×6): qty 1000

## 2020-02-28 MED ORDER — METHOCARBAMOL 500 MG IVPB - SIMPLE MED
500.0000 mg | Freq: Four times a day (QID) | INTRAVENOUS | Status: DC | PRN
  Filled 2020-02-28: qty 50

## 2020-02-28 MED ORDER — POLYETHYLENE GLYCOL 3350 17 G PO PACK
17.0000 g | PACK | Freq: Every day | ORAL | Status: DC | PRN
  Filled 2020-02-28: qty 1

## 2020-02-28 MED ORDER — METHOCARBAMOL 500 MG PO TABS
500.0000 mg | ORAL_TABLET | Freq: Four times a day (QID) | ORAL | Status: DC | PRN
  Administered 2020-02-29 – 2020-03-02 (×3): 500 mg via ORAL
  Filled 2020-02-28 (×4): qty 1

## 2020-02-28 MED ORDER — PROPOFOL 10 MG/ML IV BOLUS
INTRAVENOUS | Status: DC | PRN
  Administered 2020-02-28: 200 mg via INTRAVENOUS

## 2020-02-28 MED ORDER — FENTANYL CITRATE (PF) 250 MCG/5ML IJ SOLN
INTRAMUSCULAR | Status: AC
  Filled 2020-02-28: qty 5

## 2020-02-28 SURGICAL SUPPLY — 46 items
APL SKNCLS STERI-STRIP NONHPOA (GAUZE/BANDAGES/DRESSINGS) ×1
BENZOIN TINCTURE PRP APPL 2/3 (GAUZE/BANDAGES/DRESSINGS) ×2 IMPLANT
BIT DRILL CALIBRTD FREE HND4.3 (BIT) IMPLANT
BIT DRILL CALIBRTD SHORT 4.9MM (BIT) IMPLANT
BIT DRILL SHORT CALI 4.9 CANN (DRILL) IMPLANT
BNDG COHESIVE 6X5 TAN STRL LF (GAUZE/BANDAGES/DRESSINGS) ×2 IMPLANT
CLSR STERI-STRIP ANTIMIC 1/2X4 (GAUZE/BANDAGES/DRESSINGS) ×2 IMPLANT
COVER PERINEAL POST (MISCELLANEOUS) ×2 IMPLANT
COVER SURGICAL LIGHT HANDLE (MISCELLANEOUS) ×2 IMPLANT
COVER WAND RF STERILE (DRAPES) ×2 IMPLANT
DRAPE STERI IOBAN 125X83 (DRAPES) ×2 IMPLANT
DRILL CALIBRATED FREE HAND 4.3 (BIT) ×4
DRILL CALIBRATED SHORT 4.9MM (BIT) ×2
DRILL SHORT CALI 4.9 CANN (DRILL) ×2
DRSG MEPILEX BORDER 4X4 (GAUZE/BANDAGES/DRESSINGS) ×4 IMPLANT
DRSG MEPILEX BORDER 4X8 (GAUZE/BANDAGES/DRESSINGS) ×1 IMPLANT
DURAPREP 26ML APPLICATOR (WOUND CARE) ×2 IMPLANT
ELECT REM PT RETURN 15FT ADLT (MISCELLANEOUS) ×2 IMPLANT
GAUZE XEROFORM 5X9 LF (GAUZE/BANDAGES/DRESSINGS) ×2 IMPLANT
GLOVE BIO SURGEON STRL SZ7.5 (GLOVE) ×2 IMPLANT
GLOVE INDICATOR 8.0 STRL GRN (GLOVE) ×2 IMPLANT
GLOVE SURG ENC MOIS LTX SZ6.5 (GLOVE) ×4 IMPLANT
GLOVE SURG UNDER POLY LF SZ7 (GLOVE) ×2 IMPLANT
GOWN STRL REUS W/ TWL LRG LVL3 (GOWN DISPOSABLE) ×2 IMPLANT
GOWN STRL REUS W/TWL LRG LVL3 (GOWN DISPOSABLE) ×4
GUIDEPIN 3.0 THREADED 305MM (PIN) ×2 IMPLANT
GUIDEWIRE BALL NOSE 100CM (WIRE) ×1 IMPLANT
MANIFOLD NEPTUNE II (INSTRUMENTS) ×2 IMPLANT
NAIL FEM TROCH IM 11X38 LT (Nail) ×1 IMPLANT
NS IRRIG 1000ML POUR BTL (IV SOLUTION) ×2 IMPLANT
PACK GENERAL/GYN (CUSTOM PROCEDURE TRAY) ×2 IMPLANT
PAD ARMBOARD 7.5X6 YLW CONV (MISCELLANEOUS) ×4 IMPLANT
SCREW 5.0 FT 45MM (Screw) ×1 IMPLANT
SCREW CANC FA 6X100 (Screw) ×1 IMPLANT
SCREW CANC FEM STD 6X90 (Screw) ×1 IMPLANT
SCREW CORTICAL HEXAGON 5.0X40 (Screw) ×1 IMPLANT
SCREW HEX HEAD 3.5X42.5 (Screw) ×1 IMPLANT
SUT VIC AB 0 CT1 27 (SUTURE) ×2
SUT VIC AB 0 CT1 27XBRD ANBCTR (SUTURE) ×1 IMPLANT
SUT VIC AB 2-0 CT1 27 (SUTURE) ×4
SUT VIC AB 2-0 CT1 TAPERPNT 27 (SUTURE) IMPLANT
SUT VIC AB 3-0 SH 27 (SUTURE) ×4
SUT VIC AB 3-0 SH 27X BRD (SUTURE) ×2 IMPLANT
TOWEL OR 17X26 10 PK STRL BLUE (TOWEL DISPOSABLE) ×2 IMPLANT
TRAY CATH INTERMITTENT SS 16FR (CATHETERS) ×1 IMPLANT
WATER STERILE IRR 1000ML POUR (IV SOLUTION) ×4 IMPLANT
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
## 2020-02-28 NOTE — Discharge Instructions (Signed)
Diet: As you were doing prior to hospitalization   Shower:  May shower but keep the wounds dry, use an occlusive plastic wrap, NO SOAKING IN TUB.  If the bandage gets wet, change with a clean dry gauze.  If you have a splint on, leave the splint in place and keep the splint dry with a plastic bag.  Dressing:  You may change your dressing 3-5 days after surgery, unless you have a splint.  If you have a splint, then just leave the splint in place and we will change your bandages during your first follow-up appointment.    If you had hand or foot surgery, we will plan to remove your stitches in about 2 weeks in the office.  For all other surgeries, there are sticky tapes (steri-strips) on your wounds and all the stitches are absorbable.  Leave the steri-strips in place when changing your dressings, they will peel off with time, usually 2-3 weeks.  Activity:  Increase activity slowly as tolerated, but follow the weight bearing instructions below.  The rules on driving is that you can not be taking narcotics while you drive, and you must feel in control of the vehicle.    Weight Bearing:   Partial weight bearing with left leg.  To prevent constipation: you may use a stool softener such as -  Colace (over the counter) 100 mg by mouth twice a day  Drink plenty of fluids (prune juice may be helpful) and high fiber foods Miralax (over the counter) for constipation as needed.    Itching:  If you experience itching with your medications, try taking only a single pain pill, or even half a pain pill at a time.  You may take up to 10 pain pills per day, and you can also use benadryl over the counter for itching or also to help with sleep.   Precautions:  If you experience chest pain or shortness of breath - call 911 immediately for transfer to the hospital emergency department!!  If you develop a fever greater that 101 F, purulent drainage from wound, increased redness or drainage from wound, or calf pain --  Call the office at [phone]                                                Follow- Up Appointment:  Please call for an appointment to be seen in 2 weeks [place] - [phone]

## 2020-02-28 NOTE — ED Notes (Signed)
Pt has been taken to PACU via stretcher and report has been given to [place], Charity fundraiser.
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
## 2020-02-28 NOTE — OR Nursing (Signed)
Pt refused senna, docusate sodium, and ferrous sulfate.

## 2020-02-28 NOTE — OR Nursing (Signed)
Dwayne Villanueva from X-ray at bedside to obtain post-op x-rays.  Pt refused x-rays due to pain.  Dwayne Villanueva states he will let Dr. Dion Saucier know pt refulsed.
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
## 2020-02-28 NOTE — OR Nursing (Signed)
Pt's wife, Yvonna Alanis at bedside.

## 2020-02-28 NOTE — Anesthesia Postprocedure Evaluation (Signed)
Anesthesia Post Note  Patient: Dwayne Villanueva  Procedure(s) Performed: INTRAMEDULLARY (IM) NAIL INTERTROCHANTRIC (Left Hip)     Patient location during evaluation: PACU Anesthesia Type: General Level of consciousness: awake and alert and oriented Pain management: pain level controlled Vital Signs Assessment: post-procedure vital signs reviewed and stable Respiratory status: spontaneous breathing, nonlabored ventilation and respiratory function stable Cardiovascular status: blood pressure returned to baseline and stable Postop Assessment: no apparent nausea or vomiting Anesthetic complications: no   No complications documented.  Last Vitals:  Vitals:   02/28/20 1400 02/28/20 1500  BP: (!) 160/91 (!) 145/91  Pulse: (!) 117 (!) 118  Resp: 20 (!) 23  Temp:    SpO2: 97% 99%    Last Pain:  Vitals:   02/28/20 1500  TempSrc:   PainSc: Asleep                 Avanish Cerullo A.
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
## 2020-02-28 NOTE — OR Nursing (Signed)
Lab at bedside to obtain CBC, Creatinine and HIV.
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
## 2020-02-28 NOTE — OR Nursing (Signed)
Pt given incentive spirometer.  Pt and wife instructed on how to use incentive spirometer.
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
## 2020-02-28 NOTE — OR Nursing (Signed)
Pt awakened from sleep stating his back hurts, declines pain medication.

## 2020-02-28 NOTE — Progress Notes (Signed)
Patient has been monitored for over 6 hours in PACU and has been stable.  No tele beds available.  I don't think this is clinically necessary at this point in his care, he still has some tachycardia which is to be expected given his overall circumstances.  Plan to admit to floor bed.   Eulas Post

## 2020-02-28 NOTE — Anesthesia Procedure Notes (Signed)
Procedure Name: Intubation Date/Time: 02/28/2020 7:54 AM Performed by: Minerva Ends, CRNA Pre-anesthesia Checklist: Patient identified, Emergency Drugs available, Suction available and Patient being monitored Patient Re-evaluated:Patient Re-evaluated prior to induction Oxygen Delivery Method: Circle System Utilized Preoxygenation: Pre-oxygenation with 100% oxygen Induction Type: IV induction Ventilation: Mask ventilation without difficulty Laryngoscope Size: Miller and 2 Grade View: Grade I Tube type: Oral Number of attempts: 1 Airway Equipment and Method: Stylet Placement Confirmation: ETT inserted through vocal cords under direct vision,  positive ETCO2 and breath sounds checked- equal and bilateral Secured at: 22 cm Tube secured with: Tape Dental Injury: Teeth and Oropharynx as per pre-operative assessment  Comments: Smooth IV induction Foster-- intubation AM CRNA atraumatic-- teeth and mouth as preop -- bilat BS [employer]

## 2020-02-29 ENCOUNTER — Encounter (HOSPITAL_COMMUNITY): Payer: Self-pay | Admitting: Orthopedic Surgery

## 2020-02-29 ENCOUNTER — Other Ambulatory Visit: Payer: Self-pay

## 2020-02-29 LAB — HIV ANTIBODY (ROUTINE TESTING W REFLEX): HIV Screen 4th Generation wRfx: NONREACTIVE

## 2020-02-29 MED ORDER — OXYCODONE HCL 5 MG PO TABS
ORAL_TABLET | ORAL | Status: AC
  Filled 2020-02-29: qty 1

## 2020-02-29 NOTE — Progress Notes (Addendum)
Patient's significant other was told about the visiting policy and the [facility] came and spoke to them, she will try to find a ride home. I spoke to patient's sister and mother about patient's current situation, permission given by patient. Patient's mother Karoline Caldwell [phone] would like for the MD to call her tomorrow and an update about the surgery.
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
## 2020-03-01 ENCOUNTER — Encounter (HOSPITAL_COMMUNITY): Payer: Self-pay | Admitting: Orthopedic Surgery

## 2020-03-01 MED ORDER — HYDROXYZINE HCL 10 MG PO TABS
10.0000 mg | ORAL_TABLET | Freq: Three times a day (TID) | ORAL | Status: DC | PRN
  Filled 2020-03-01 (×2): qty 1

## 2020-03-01 MED ORDER — HYDROXYZINE HCL 10 MG PO TABS: 10.0000 mg | ORAL_TABLET | Freq: Three times a day (TID) | ORAL | Status: DC | PRN

## 2020-03-01 MED ORDER — SODIUM CHLORIDE 0.9 % IV SOLN: INTRAVENOUS | Status: DC

## 2020-03-01 NOTE — Progress Notes (Signed)
Patient refused to be transferred to X- ray. We will continue to monitor.

## 2020-03-01 NOTE — Progress Notes (Signed)
Inpatient Rehabilitation Admissions Coordinator  Inpatient rehab consult received. I attempted contact by phone at 12 noon today with girlfriend, Luther Parody , answering phone. States patient asleep and to please call back later. Second attempt at 3 pm with no answer to phone. I will follow up tomorrow.  Ottie Glazier, RN, MSN Rehab Admissions Coordinator [phone] 03/01/2020 3:01 PM
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
## 2020-03-02 DIAGNOSIS — R000 Tachycardia, unspecified: Secondary | ICD-10-CM

## 2020-03-02 LAB — COMPREHENSIVE METABOLIC PANEL
ALT: 105 U/L — ABNORMAL HIGH (ref 0–44)
AST: 91 U/L — ABNORMAL HIGH (ref 15–41)
Albumin: 3.1 g/dL — ABNORMAL LOW (ref 3.5–5.0)
Alkaline Phosphatase: 84 U/L (ref 38–126)
Anion gap: 10 (ref 5–15)
BUN: 10 mg/dL (ref 6–20)
CO2: 29 mmol/L (ref 22–32)
Calcium: 8.7 mg/dL — ABNORMAL LOW (ref 8.9–10.3)
Chloride: 94 mmol/L — ABNORMAL LOW (ref 98–111)
Creatinine, Ser: 0.68 mg/dL (ref 0.61–1.24)
GFR, Estimated: >60 mL/min (ref >60)
Glucose, Bld: 153 mg/dL — ABNORMAL HIGH (ref 70–99)
Potassium: 4.6 mmol/L (ref 3.5–5.1)
Sodium: 133 mmol/L — ABNORMAL LOW (ref 135–145)
Total Bilirubin: 1.3 mg/dL — ABNORMAL HIGH (ref 0.3–1.2)
Total Protein: 6.7 g/dL (ref 6.5–8.1)

## 2020-03-02 LAB — CBC WITH DIFFERENTIAL/PLATELET
Abs Immature Granulocytes: 0.04 10*3/uL (ref 0.00–0.07)
Basophils Absolute: 0 10*3/uL (ref 0.0–0.1)
Basophils Relative: 0 %
Eosinophils Absolute: 0.2 10*3/uL (ref 0.0–0.5)
Eosinophils Relative: 2 %
HCT: 34.5 % — ABNORMAL LOW (ref 39.0–52.0)
Hemoglobin: 11.1 g/dL — ABNORMAL LOW (ref 13.0–17.0)
Immature Granulocytes: 0 %
Lymphocytes Relative: 18 %
Lymphs Abs: 1.8 10*3/uL (ref 0.7–4.0)
MCH: 28.2 pg (ref 26.0–34.0)
MCHC: 32.2 g/dL (ref 30.0–36.0)
MCV: 87.8 fL (ref 80.0–100.0)
Monocytes Absolute: 1.2 10*3/uL — ABNORMAL HIGH (ref 0.1–1.0)
Monocytes Relative: 12 %
Neutro Abs: 7.1 10*3/uL (ref 1.7–7.7)
Neutrophils Relative %: 68 %
Platelets: 282 10*3/uL (ref 150–400)
RBC: 3.93 MIL/uL — ABNORMAL LOW (ref 4.22–5.81)
RDW: 12.5 % (ref 11.5–15.5)
WBC: 10.5 10*3/uL (ref 4.0–10.5)
nRBC: 0 % (ref 0.0–0.2)

## 2020-03-02 LAB — TSH: TSH: 1.655 u[IU]/mL (ref 0.350–4.500)

## 2020-03-02 LAB — BRAIN NATRIURETIC PEPTIDE: B Natriuretic Peptide: 30.9 pg/mL (ref 0.0–100.0)

## 2020-03-02 NOTE — Progress Notes (Signed)
Patient refused lab draw this am.

## 2020-03-02 NOTE — Progress Notes (Signed)
     Subjective: 3 Days Post-Op s/p Procedure(s): INTRAMEDULLARY (IM) NAIL INTERTROCHANTRIC  Patient somnolent on exam. Yelling out "don't touch me" when woken with taps to shoulder. Denies chest pain, shortness of breath, nausea, vomiting, diarrhea, abdominal pain. Voiding. States pain is severe this morning. Patient threatening to leave AMA, states "I am getting out of here today".   Objective:  PE: VITALS:   Vitals:   03/01/20 0535 03/01/20 1337 03/01/20 2155 03/02/20 0548  BP: 138/90 135/82 132/78 134/77  Pulse: (!) 123 99 (!) 124 (!) 124  Resp: 18 18 18 17   Temp: 98.4 F (36.9 C) 98.6 F (37 C) 99.5 F (37.5 C) 99.6 F (37.6 C)  TempSrc: Oral Oral Oral Oral  SpO2: 98% 100% 97% 97%  Weight:      Height:       General: somnolent, but alert when awoken, oriented x 3, in no acute distress Cardio: normal rhythm, increased rate Resp: lungs clear to auscultation GI: abdomen soft, nontender MSK: LLE - Patient refused dressings changes to all 4 dressings. Moderate edema of the thigh. Dorsiflex and plantarflexion intact. Distal sensation intact. 2+DP and PT pulses. Able to move all toes. TTP diffusely to left thigh and knee with apprehension. Calf compressible.   LABS  No results found for this or any previous visit (from the past 24 hour(s)).  No results found.  Assessment/Plan: Displaced comminuted fracture of left femur - 3 day post-op left femur IM nail and foreign body removal - patient refused dressing changes today Weightbearing:TDWBLLE, up with therapy Insicional and dressing care:Reinforce dressings as needed VTE prophylaxis:aspirin 325 mg BID Follow - up plan:2 weekswith Dr.  History of Drug Abuse  - last methamphetamine taken 02/27/19 - [facility] 8 [facility], continuing home dose as inpatient - encouraged patient to take suboxone while inpatient for more adequate pain control along with prn options, patient agreed  yesterday. Refused when I had long conversation about it today.   Nicotine Dependency - smoking cessation discussed at length with patient with regarding to healing and vasoconstriction, will hold on nicotine patch right now  Tachycardia: - patient continues to be tachycardiac while at rest, have attempted work up including labs, EKG, chest x-ray. Patient has refused all of these measures. I had another long discussion with him this morning regarding the needs for these measures and that the results of not doing them could end in consequences as severe as mortality. Patient expressed understanding and continued to refuse.  - will consult medicine for functional capacity examination regarding refusal of care as well as further recommendations for management regarding tachycardia  Patient threatening to leave AMA, states "someone is going to pick me up and get me out of here today" I discussed with him the long term consequences of leaving AMA including but not limited to infection, long term cardiac or pulmonary issues, loss of limb, blood clots, nerve injury, and mortality. Patient expressed understanding. I discussed with him that if he is planning to stay, it is absolutely necessary that he cooperate with our care efforts.   Contact information:   Weekdays 8-5 10-16-2005, Janine Ores [place] A fter hours and holidays please check [email or web address] for group call information for [facility]  [phone] 03/02/2020, 9:19 AM
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
## 2020-03-02 NOTE — Progress Notes (Signed)
Inpatient Rehabilitation Admissions Coordinator   I have been unable to reach patient by phone this am. Per therapy treatment , patient limited by pain yesterday. Not yet at a level to be considered for intensive inpatient rehab/CIR admit. I will follow up today.  Ottie Glazier, RN, MSN Rehab Admissions Coordinator [phone] 03/02/2020 8:13 AM

## 2020-03-03 ENCOUNTER — Encounter (HOSPITAL_COMMUNITY): Payer: Self-pay | Admitting: Orthopedic Surgery

## 2020-03-03 NOTE — Transfer of Care (Signed)
Immediate Anesthesia Transfer of Care Note  Patient: Dwayne Villanueva  Procedure(s) Performed: INTRAMEDULLARY (IM) NAIL INTERTROCHANTRIC (Left Hip)  Patient Location: PACU  Anesthesia Type:General  Level of Consciousness: sedated  Airway & Oxygen Therapy: Patient Spontanous Breathing and Patient connected to face mask oxygen  Post-op Assessment: Report given to RN and Post -op Vital signs reviewed and stable  Post vital signs: Reviewed and stable  Last Vitals:  Vitals Value Taken Time  BP 134/77 03/02/20 0548  Temp 37.6 C 03/02/20 0548  Pulse 124 03/02/20 0548  Resp 17 03/02/20 0548  SpO2 97 % 03/02/20 0548    Last Pain:  Vitals:   03/02/20 1006  TempSrc:   PainSc: 5       Patients Stated Pain Goal: 2 (03/02/20 0900)  Complications: No complications documented.
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
